# Patient Record
Sex: Female | Born: 1960 | Race: White | Hispanic: No | Marital: Married | Smoking: Former smoker
Health system: Southern US, Community
[De-identification: ages and names within clinical notes are randomized; demographics above are authoritative.]

## PROBLEM LIST (undated history)

## (undated) DIAGNOSIS — F32A Depression, unspecified: Secondary | ICD-10-CM

## (undated) DIAGNOSIS — F4310 Post-traumatic stress disorder, unspecified: Secondary | ICD-10-CM

## (undated) DIAGNOSIS — F329 Major depressive disorder, single episode, unspecified: Secondary | ICD-10-CM

## (undated) HISTORY — PX: BREAST ENHANCEMENT SURGERY: SHX7

## (undated) HISTORY — PX: SHOULDER ARTHROSCOPY DISTAL CLAVICLE EXCISION AND OPEN ROTATOR CUFF REPAIR: SHX2396

---

## 2015-04-03 ENCOUNTER — Encounter: Payer: Self-pay | Admitting: Sports Medicine

## 2015-04-03 ENCOUNTER — Ambulatory Visit (INDEPENDENT_AMBULATORY_CARE_PROVIDER_SITE_OTHER): Payer: Managed Care, Other (non HMO) | Admitting: Sports Medicine

## 2015-04-03 VITALS — BP 113/69 | HR 73 | Ht 65.0 in | Wt 140.0 lb

## 2015-04-03 DIAGNOSIS — M25579 Pain in unspecified ankle and joints of unspecified foot: Secondary | ICD-10-CM | POA: Insufficient documentation

## 2015-04-03 DIAGNOSIS — M216X9 Other acquired deformities of unspecified foot: Secondary | ICD-10-CM

## 2015-04-03 DIAGNOSIS — Q667 Congenital pes cavus, unspecified foot: Secondary | ICD-10-CM | POA: Insufficient documentation

## 2015-04-03 DIAGNOSIS — M25572 Pain in left ankle and joints of left foot: Secondary | ICD-10-CM | POA: Diagnosis not present

## 2015-04-03 NOTE — Assessment & Plan Note (Signed)
With the degree of forefoot breakdown including bilateral Morton's calluses she will need some transverse arch support  In addition her longitudinal arch shows a significant drop and she's starting to get early bunion formation  She should return with running shoes and we will make her custom orthotics
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
## 2015-04-03 NOTE — Assessment & Plan Note (Signed)
Arch straps given today  We will also assess different shoes to see if better support may lessen her pain

## 2018-12-29 ENCOUNTER — Encounter (HOSPITAL_COMMUNITY): Payer: Self-pay | Admitting: Emergency Medicine

## 2018-12-29 ENCOUNTER — Emergency Department (HOSPITAL_COMMUNITY): Payer: Managed Care, Other (non HMO)

## 2018-12-29 ENCOUNTER — Other Ambulatory Visit: Payer: Self-pay

## 2018-12-29 ENCOUNTER — Emergency Department (HOSPITAL_COMMUNITY)
Admission: EM | Admit: 2018-12-29 | Discharge: 2018-12-29 | Disposition: A | Discharge status: Home / Self Care | Payer: Managed Care, Other (non HMO) | Attending: Emergency Medicine | Admitting: Emergency Medicine

## 2018-12-29 DIAGNOSIS — Z87891 Personal history of nicotine dependence: Secondary | ICD-10-CM | POA: Diagnosis not present

## 2018-12-29 DIAGNOSIS — R55 Syncope and collapse: Secondary | ICD-10-CM | POA: Insufficient documentation

## 2018-12-29 DIAGNOSIS — Z79899 Other long term (current) drug therapy: Secondary | ICD-10-CM | POA: Diagnosis not present

## 2018-12-29 DIAGNOSIS — M542 Cervicalgia: Secondary | ICD-10-CM | POA: Insufficient documentation

## 2018-12-29 DIAGNOSIS — Z041 Encounter for examination and observation following transport accident: Secondary | ICD-10-CM | POA: Insufficient documentation

## 2018-12-29 HISTORY — DX: Depression, unspecified: F32.A

## 2018-12-29 HISTORY — DX: Post-traumatic stress disorder, unspecified: F43.10

## 2018-12-29 HISTORY — DX: Major depressive disorder, single episode, unspecified: F32.9

## 2018-12-29 LAB — URINALYSIS, ROUTINE W REFLEX MICROSCOPIC
Bilirubin Urine: NEGATIVE
Glucose, UA: NEGATIVE mg/dL
Hgb urine dipstick: NEGATIVE
KETONES UR: 5 mg/dL — AB
LEUKOCYTES UA: NEGATIVE
Nitrite: NEGATIVE
Protein, ur: NEGATIVE mg/dL
Specific Gravity, Urine: 1.03 (ref 1.005–1.030)
pH: 5 (ref 5.0–8.0)

## 2018-12-29 LAB — PREGNANCY, URINE: PREG TEST UR: NEGATIVE

## 2018-12-29 MED ORDER — HYDROCODONE-ACETAMINOPHEN 5-325 MG PO TABS: 2.0000 | ORAL_TABLET | ORAL | 0 refills | Status: AC | PRN

## 2018-12-29 MED ORDER — DIAZEPAM 5 MG PO TABS
5.0000 mg | ORAL_TABLET | Freq: Once | ORAL | Status: AC
  Administered 2018-12-29: 5 mg via ORAL
  Filled 2018-12-29: qty 1

## 2018-12-29 MED ORDER — OXYCODONE-ACETAMINOPHEN 5-325 MG PO TABS
1.0000 | ORAL_TABLET | Freq: Once | ORAL | Status: AC
  Administered 2018-12-29: 1 via ORAL
  Filled 2018-12-29: qty 1

## 2018-12-29 MED ORDER — METHOCARBAMOL 750 MG PO TABS: 750.0000 mg | ORAL_TABLET | Freq: Four times a day (QID) | ORAL | 0 refills | Status: AC

## 2018-12-29 NOTE — ED Notes (Signed)
ED Provider at bedside. 
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
## 2018-12-29 NOTE — ED Triage Notes (Signed)
Pt reported increased pain in r/ribs

## 2018-12-29 NOTE — ED Notes (Signed)
Patient transported to CT 

## 2018-12-29 NOTE — ED Notes (Signed)
Patient transported to X-ray 
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
